# Patient Record
Sex: Female | Born: 1985 | Race: White | Hispanic: No | Marital: Single | Smoking: Current every day smoker
Health system: Southern US, Community
[De-identification: ages and names within clinical notes are randomized; demographics above are authoritative.]

## PROBLEM LIST (undated history)

## (undated) DIAGNOSIS — A64 Unspecified sexually transmitted disease: Secondary | ICD-10-CM

## (undated) DIAGNOSIS — Z8619 Personal history of other infectious and parasitic diseases: Secondary | ICD-10-CM

## (undated) DIAGNOSIS — IMO0002 Reserved for concepts with insufficient information to code with codable children: Secondary | ICD-10-CM

## (undated) HISTORY — DX: Personal history of other infectious and parasitic diseases: Z86.19

## (undated) HISTORY — DX: Unspecified sexually transmitted disease: A64

## (undated) HISTORY — PX: TYMPANOSTOMY TUBE PLACEMENT: SHX32

## (undated) HISTORY — DX: Reserved for concepts with insufficient information to code with codable children: IMO0002

---

## 2013-05-03 ENCOUNTER — Encounter: Payer: Self-pay | Admitting: Gynecology

## 2013-05-03 ENCOUNTER — Ambulatory Visit (INDEPENDENT_AMBULATORY_CARE_PROVIDER_SITE_OTHER): Payer: BC Managed Care – PPO | Admitting: Gynecology

## 2013-05-03 VITALS — BP 120/86 | HR 80 | Resp 14 | Ht 66.0 in | Wt 200.0 lb

## 2013-05-03 DIAGNOSIS — G8929 Other chronic pain: Secondary | ICD-10-CM

## 2013-05-03 DIAGNOSIS — R102 Pelvic and perineal pain: Secondary | ICD-10-CM

## 2013-05-03 DIAGNOSIS — Z0000 Encounter for general adult medical examination without abnormal findings: Secondary | ICD-10-CM

## 2013-05-03 DIAGNOSIS — N949 Unspecified condition associated with female genital organs and menstrual cycle: Secondary | ICD-10-CM

## 2013-05-03 LAB — POCT URINALYSIS DIPSTICK
PH UA: 5
UROBILINOGEN UA: NEGATIVE

## 2013-05-03 NOTE — Progress Notes (Signed)
28 y.o. Single Caucasian female   G0P0000 here for problem visit. Pt reports bilateral and low back pain for several months.  She had u/s done at [facility] in [facility], [place] 59m ago and was diagnosed with ovarian cysts. Pt was referred to gyn and was given diagnosis of questionable endometriosis and was offered Lupron, she had not had surgical diagnosis.  Pt does not recall being told she had endometriomas  Pt had tried OCP in past but had break through bleeding, same issue with DepoProvera. Last used 5y ago, but pain just began in last 52m.   Cycles regular but flow is variable, severe cramping.  Pt will use heating pads- had used pamprin, midol, vicoden and most recently percocet which she got from her family.   Pt reports that her thyroid had been checked recently after 55# weight gain and was normal. LMP 12/25, day 14 of cycle Pt is currently not sexually active.  First sexual activity at 28 years old, does not want to disclose number of lifetime partners.     Patient's last menstrual period was 04/21/2013.          Sexually active: not since 2012 The current method of family planning is none.    Exercising: yes  constantly running around at work Last pap: 6 months ago Normal Alcohol: depends glass of wine/beer couple nights a week  Tobacco: under a pack a day Drugs: no Gardisil: yes, completed: 2006-2009  Urine: Leuks 2, Trace Protein, Small Ketones, Tiny Trace Protein   No health maintenance topics applied.  Family History  Problem Relation Age of Onset  . Thyroid disease Mother   . Diabetes Mother   . Diabetes Maternal Grandmother   . Diabetes Paternal Grandfather     There are no active problems to display for this patient.   Past Medical History  Diagnosis Date  . Dyspareunia   . Endometriosis   . STD (sexually transmitted disease)   . History of chlamydia 2010/2011    Past Surgical History  Procedure Laterality Date  . Tympanostomy tube placement  When she was  a baby    Allergies: Review of patient's allergies indicates no known allergies.  Current Outpatient Prescriptions  Medication Sig Dispense Refill  . Ascorbic Acid (VITAMIN C PO) Take by mouth.      . Psyllium (METAMUCIL PO) Take by mouth.       No current facility-administered medications for this visit.    ROS: Pertinent items are noted in HPI.  Exam:    BP 120/86  Pulse 80  Resp 14  Ht 5\' 6"  (1.676 m)  Wt 200 lb (90.719 kg)  BMI 32.30 kg/m2  LMP 04/21/2013 Weight change: @WEIGHTCHANGE @ Last 3 height recordings:  Ht Readings from Last 3 Encounters:  05/03/13 5\' 6"  (1.676 m)   General appearance: alert, cooperative and appears stated age, uncomfortable Head: Normocephalic, without obvious abnormality, atraumatic Neck: no adenopathy, no carotid bruit, no JVD, supple, symmetrical, trachea midline and thyroid not enlarged, symmetric, no tenderness/mass/nodules Lungs: clear to auscultation bilaterally Breasts: normal appearance, no masses or tenderness Heart: regular rate and rhythm, S1, S2 normal, no murmur, click, rub or gallop Abdomen: soft, non-tender; bowel sounds normal; no masses,  no organomegaly Extremities: extremities normal, atraumatic, no cyanosis or edema Skin: Skin color, texture, turgor normal. No rashes or lesions Lymph nodes: Cervical, supraclavicular, and axillary nodes normal. no inguinal nodes palpated Neurologic: Grossly normal   Pelvic: External genitalia:  no lesions  Urethra: normal appearing urethra with no masses, tenderness or lesions              Bartholins and Skenes: normal                 Vagina: normal appearing vagina with normal color and discharge, no lesions              Cervix: normal appearance              Pap taken: no        Bimanual Exam:  Uterus:  tenderness anteriorly, anteverted, mobile                                      Adnexa:    difficult to assess due to habitus                                       Rectovaginal: attenuation, nodularity and tenderness bilateral, right greater than left. Levator tenderness less so                                      Anus:  normal sphincter tone, no lesions  A: pelvic pain Questionable endometriosis Partial evaluation  P: will get records from prior visits including u/s to determine if endometriomas are present.  Exam suspicious for endometriosis due to marked attenuation and nodularity of USL's bilaterally. Discussed ocp, progestins and IUD to treat symptoms but will be based on records and u/s.  Discussed the affect of each on pain.  Would prefer not to use narcotics-risk of dependence discussed.  Pt offered tramadol but reports she used a family member's tramadol in past without relief.  It would be helpful to see her full records so we can assess her prior medication trials.  We suggest she rto in 2w to review her records and she was agreeable.  We also mentioned trial of elavil or celexa.  Pt would like to try IUD if possible Suggest acupuncture as well She will loose her insurance on her [birth year], we will try to place IUD before that time, we may need beta HCG if cycle had not started.  An After Visit Summary was printed and given to the patient.   Over 3436m spent discussing pelvic pain and coordinating treatment options, >50% face to face

## 2013-05-04 ENCOUNTER — Telehealth: Payer: Self-pay | Admitting: Gynecology

## 2013-05-04 DIAGNOSIS — R102 Pelvic and perineal pain: Secondary | ICD-10-CM

## 2013-05-04 DIAGNOSIS — G8929 Other chronic pain: Secondary | ICD-10-CM | POA: Insufficient documentation

## 2013-05-04 NOTE — Telephone Encounter (Signed)
Return call to StapletonSai.  Advised that we do not have a GYN pain clinic in [place] and that we usually refer to [facility] as well. Janina MayoSai states patient called her directly today regarding referral from summer 2014. They gave patient the phone number to the clinic to let patient try to reschedule. Per OV note from yesterday, patient will lose insurance soon so was to return here in 2 weeks to discuss options and possible IUD. Not usually able to get an appointment with [facility] that quickly. Patient  does not have a follow-up appointment scheduled with our office.

## 2013-05-04 NOTE — Telephone Encounter (Signed)
Patient reached out to her [facility] in [place] after her visit with Dr. Farrel GobbleLathrop yesterday requesting a referral from them to a local pain clinic to help with her pelvic pain. Patient was referred to [facility] by them last summer but the patient never went. The office is calling to clarify with us what the patient may need after her visit yesterday and see if there is a pain clinic in [place].

## 2013-05-08 NOTE — Telephone Encounter (Signed)
Called patient/ 0 patient responsibility for IUD insertion/ advised that per insurance, coverage will term 01.31.2015/ patient needs to come in for IUD insertion during first 5 days of cycle./ssf

## 2013-05-16 NOTE — Telephone Encounter (Signed)
Pt says she is no longer interested in having the iud because she already know that she is going to bleed alot with it and she is not going thru that again.

## 2013-05-16 NOTE — Telephone Encounter (Signed)
Routing to Dr. Lathrop.   

## 2013-05-16 NOTE — Telephone Encounter (Signed)
It is her decision.  Encounter closed.

## 2013-11-24 ENCOUNTER — Ambulatory Visit (INDEPENDENT_AMBULATORY_CARE_PROVIDER_SITE_OTHER): Payer: BC Managed Care – PPO | Admitting: Internal Medicine

## 2013-11-24 VITALS — BP 130/90 | HR 70 | Temp 98.2°F | Resp 16 | Ht 67.0 in | Wt 200.4 lb

## 2013-11-24 DIAGNOSIS — M545 Low back pain, unspecified: Secondary | ICD-10-CM

## 2013-11-24 MED ORDER — PREDNISONE 20 MG PO TABS: ORAL_TABLET | ORAL | Status: AC

## 2013-11-24 MED ORDER — CYCLOBENZAPRINE HCL 10 MG PO TABS: 10.0000 mg | ORAL_TABLET | Freq: Every day | ORAL | Status: DC

## 2013-11-24 MED ORDER — OXYCODONE-ACETAMINOPHEN 10-325 MG PO TABS: ORAL_TABLET | ORAL | Status: DC

## 2013-11-24 NOTE — Progress Notes (Signed)
Subjective:  This chart was scribed for Dr. Linton Ham. Laney Pastor, MD  by Stacy Gardner, [facility] Scribe. The patient was seen in room and the patient's care was started at 12:30 PM.  Chief Complaint  Patient presents with  . 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  pt states she has took OTC Aleve and does not provide no relief. she states she has used hot/cold compress with minor relief      Patient ID: Rhonda Perry, female    DOB: [birth year], 28 y.o.   MRN: [id number]  11/24/2013  Back Pain   Back Pain Associated symptoms include numbness and pelvic pain. Pertinent negatives include no headaches or weakness.   HPI Comments: Rhonda Perry is a 28 y.o. female who arrives to the [facility] complaining of constant, moderate, lumbar pain. 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 The pain radiates "up my spine" .  Pt reports having severe pain with twisting and movement. She was unable to sleep due to pain last night. She has intermittent bilateral numbness to her feet. Pt 16 injections to her back at [facility] by Dr. Vira Blanco they only give her temporary relief.  She was seen 1.5 month ago and he has not followed up with her since that visit--she has canceled her association with that clinic because they have not been returning her phone calls. Pt was given a percocet rx and ran out of it recently. She was taking Meloxicam but it makes her sick. She has tried one round of Prednisone with? Benefits. Pt has tried [employer] w/o any relief.  She has tried taking OTC Aleve w/ no relief. She has also tried hot/cold compresses with mild relief. Nothing seems to make her symptoms resolve .She reports being clumsy and this has been a issue for her most of her life. Denies pain with deep inspiration. Pt was told that PT was an  option and was given stretches to do while at home. 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 Denies prior injury, trauma, or MVC. Pt has to lie in bed for about an hour prior to getting up. Denies any exacerbated gait issues.  She works at [employer] and tries to abstain from lifting heavy objects. Nothing seems to help.     Past Medical History  Diagnosis Date  . Dyspareunia   . STD (sexually transmitted disease)   . History of chlamydia 2010/2011   Past Surgical History  Procedure Laterality Date  . Tympanostomy tube placement  When she was a baby   History   Social History  . Marital Status: Single    Spouse Name: N/A    Number of Children: N/A  . Years of Education: N/A   Occupational History  . Not on file.   Social History Main Topics  . Smoking status: Current Every Day Smoker -- 0.50 packs/day    Types: Cigarettes  . Smokeless tobacco: Not on file  . Alcohol Use: 0.6 oz/week    1 Glasses of wine per week  . Drug Use: No  . Sexual Activity: Not on file   Other Topics Concern  . Not on file   Social History Narrative  . No narrative on file   Family History  Problem Relation Age of Onset  . Thyroid disease Mother   . Diabetes Mother   . Cancer Mother   . Diabetes Maternal Grandmother   . Diabetes Paternal Grandfather    Allergies  Allergen Reactions  . Tramadol Nausea And Vomiting  . Vicodin [Hydrocodone-Acetaminophen] Nausea And Vomiting   Current Outpatient Prescriptions  Medication Sig Dispense Refill  . Ascorbic Acid (VITAMIN C PO) Take by mouth.       No current facility-administered medications for this visit.       Review of Systems  Genitourinary: Positive for pelvic pain. Negative for frequency, hematuria and difficulty urinating.       History of chronic pelvic pain which is not currently active  Musculoskeletal: Positive for back pain and gait problem (baseline).  Neurological: Positive for  numbness. Negative for tremors, weakness and headaches.  Psychiatric/Behavioral: Positive for sleep disturbance.     Objective:     Physical Exam  Nursing note and vitals reviewed. Constitutional: She is oriented to person, place, and time. She appears well-developed and well-nourished.  overweight  HENT:  Head: Normocephalic.  Eyes: Pupils are equal, round, and reactive to light.  Neck: Normal range of motion.  Cardiovascular: Normal rate.   Pulmonary/Chest: Effort normal. No respiratory distress.  Musculoskeletal: She exhibits tenderness.  tender to palpation throughout L1 to L5 including all of paraspinal muscles.  Pain with twisting and flexion. Straight leg raise is negative on the R and mildly positive on the L at 90 degrees DT normal No sensory loss in lower extremity No motor loss in lower extremity   Neurological: She is alert and oriented to person, place, and time.  Skin: Skin is warm and dry. She is not diaphoretic.  Psychiatric: She has a normal mood and affect. Her behavior is normal.    Filed Vitals:   11/24/13 1156  BP: 130/90  Pulse: 70  Temp: 98.2 F (36.8 C)  TempSrc: Oral  Resp: 16  Height: $Remove'5\' 7"'erfxKfv$  (1.702 m)  Weight: 200 lb 6.4 oz (90.901 kg)  SpO2: 99%        Assessment & Plan:  Bilateral low back pain without sciatica - Plan: Ambulatory referral to Physical Therapy  Complex clinical situation but the one thing she has not done is have adequate physical therapy She also will get the office notes from [facility] and [facility] so we can make a better plan for the future  Meds ordered this encounter  Medications  . oxyCODONE-acetaminophen (PERCOCET) 10-325 MG per tablet    Sig: 1/2 -1 HS prn back pain    Dispense:  30 tablet    Refill:  0  . cyclobenzaprine (FLEXERIL) 10 MG tablet    Sig: Take 1 tablet (10 mg total) by mouth at bedtime.    Dispense:  30 tablet    Refill:  0  . predniSONE (DELTASONE) 20 MG  tablet    Sig: 3/3/2/2/1/1 single daily dose for 6 days    Dispense:  12 tablet    Refill:  0   F/u 2wk  I personally performed the services described in this documentation, which was scribed in my presence. The recorded information has been reviewed and is accurate.

## 2013-12-12 ENCOUNTER — Other Ambulatory Visit (HOSPITAL_COMMUNITY)
Admission: RE | Admit: 2013-12-12 | Discharge: 2013-12-12 | Disposition: A | Discharge status: Home / Self Care | Payer: BC Managed Care – PPO | Source: Ambulatory Visit | Attending: Family Medicine | Admitting: Family Medicine

## 2013-12-12 ENCOUNTER — Other Ambulatory Visit: Payer: Self-pay | Admitting: Family Medicine

## 2013-12-12 DIAGNOSIS — Z1151 Encounter for screening for human papillomavirus (HPV): Secondary | ICD-10-CM | POA: Insufficient documentation

## 2013-12-12 DIAGNOSIS — Z113 Encounter for screening for infections with a predominantly sexual mode of transmission: Secondary | ICD-10-CM | POA: Insufficient documentation

## 2013-12-12 DIAGNOSIS — Z124 Encounter for screening for malignant neoplasm of cervix: Secondary | ICD-10-CM | POA: Diagnosis not present

## 2013-12-14 LAB — CYTOLOGY - PAP

## 2013-12-19 ENCOUNTER — Ambulatory Visit (INDEPENDENT_AMBULATORY_CARE_PROVIDER_SITE_OTHER): Payer: BC Managed Care – PPO | Admitting: Internal Medicine

## 2013-12-19 ENCOUNTER — Telehealth: Payer: Self-pay | Admitting: Internal Medicine

## 2013-12-19 ENCOUNTER — Encounter: Payer: Self-pay | Admitting: Internal Medicine

## 2013-12-19 VITALS — BP 122/78 | HR 89 | Temp 98.0°F | Resp 17 | Ht 67.0 in | Wt 207.0 lb

## 2013-12-19 DIAGNOSIS — M545 Low back pain, unspecified: Secondary | ICD-10-CM

## 2013-12-19 DIAGNOSIS — G894 Chronic pain syndrome: Secondary | ICD-10-CM

## 2013-12-19 MED ORDER — CYCLOBENZAPRINE HCL 10 MG PO TABS: 10.0000 mg | ORAL_TABLET | Freq: Every day | ORAL | Status: DC

## 2013-12-19 MED ORDER — OXYCODONE-ACETAMINOPHEN 10-325 MG PO TABS: ORAL_TABLET | ORAL | Status: AC

## 2013-12-19 NOTE — Telephone Encounter (Signed)
Refill on Hydrocodone and Percocet   [phone]

## 2013-12-19 NOTE — Progress Notes (Signed)
Subjective:    Patient ID: Rhonda Perry, female    DOB: 09/29/1985, 28 y.o.   MRN: [id number]  This chart was scribed for Rhonda Sia, MD by Jarvis Morgan, Medical Scribe. This patient was seen in Room 9 and the patient's care was started at 8:21 PM.  Chief Complaint  Patient presents with  . Back Pain  . Medication Refill    HPI HPI Comments: [name] is a 28 y.o. female who presents to the [facility] for a medication refill. Patient is having constant, moderate, lumbar pain that has been occurring for the past month. She states that the pain radiates up her spine. 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 Pt was last seen in the office on 11/24/13 and was prescribed Prednisone. She states that the Prednisone has been helping somewhat. She was also prescribed Flexeril and Percocet. She admits she has been trying not to take the Percocet very often and only uses it as a last resort. She reports good relief with the Flexeril and helps her to get to sleep at night. She says that she sometimes has trouble falling asleep due to the pain. She also uses Tiger Balm for the pain and says it provides moderate relief as well. Tylenol does not seem to help with her pain at all. Has worn a back brace during the day and at work. She is waiting for insurance to approve PT. She also reports she has been trying to get Dr. Jordan Likes to fax her medical records to our office so she can be referred to [facility]. She denies any prior injury, trauma, or exacerbated gait issues. She also denies any urinary or bowel incontinence. She works at [employer] and tries to abstain from lifting heavy objects. She had a physical last week and all her lab work came back normal. She sees Dr. Beverley Fiedler at [facility].  Patient Active Problem List   Diagnosis Date Noted  . Chronic pelvic pain in female 05/04/2013    Past Medical History  Diagnosis Date  . Dyspareunia   . STD (sexually transmitted disease)   . History of chlamydia 2010/2011   Past Surgical History  Procedure Laterality Date  . Tympanostomy tube placement  When she was a baby   Allergies  Allergen Reactions  . Tramadol Nausea And Vomiting  . Vicodin [Hydrocodone-Acetaminophen] Nausea And Vomiting   Prior to Admission medications   Medication Sig Start Date End Date Taking? Authorizing Provider  Ascorbic Acid (VITAMIN C PO) Take by mouth.   Yes Historical Provider, MD  cyclobenzaprine (FLEXERIL) 10 MG tablet Take 1 tablet (10 mg total) by mouth at bedtime. 11/24/13   Tonye Pearson, MD  oxyCODONE-acetaminophen [facility]) 10-325 MG per tablet 1/2 -1 HS prn back pain 11/24/13   Tonye Pearson, MD  predniSONE (DELTASONE) 20 MG tablet 3/3/2/2/1/1 single daily dose for 6 days 11/24/13   Tonye Pearson, MD   History   Social History  . Marital Status: Single    Spouse Name: N/A    Number of Children: N/A  . Years of Education: N/A   Occupational History  . Not on file.   Social History Main Topics  . Smoking status: Current Every Day Smoker -- 0.50 packs/day for 5 years    Types: Cigarettes  . Smokeless tobacco: Not on file  . Alcohol Use: 0.6 oz/week  1 Glasses of wine per week  . Drug Use: No  . Sexual Activity: Not on file   Other Topics Concern  . Not on file   Social History Narrative  . No narrative on file     Review of Systems  Musculoskeletal: Positive for back pain. Negative for gait problem.  Psychiatric/Behavioral: Positive for sleep disturbance (trouble sleeping due to pain).  All other systems reviewed and are negative.      Objective:   Physical Exam Physical Exam  Nursing note and vitals reviewed. Constitutional: She is oriented to person, place, and time. She appears well-developed and well-nourished. No distress.  HENT:  Head: Normocephalic and atraumatic.  Eyes:  Conjunctivae and EOM are normal.  Neck: Neck supple.  Cardiovascular: Normal rate.   Pulmonary/Chest: Effort normal. No respiratory distress.  Musculoskeletal: Normal range of motion. Tender to palpation over lumbar area. Pain with SLR to 90 degrees bilaterally. Neurological: She is alert and oriented to person, place, and time.  Skin: Skin is warm and dry.  Psychiatric: She has a normal mood and affect. Her behavior is normal.    Filed Vitals:   12/19/13 2019  BP: 122/78  Pulse: 89  Temp: 98 F (36.7 C)  TempSrc: Oral  Resp: 17  Height:  (1.702 m)  Weight: 207 lb (93.895 kg)  SpO2: 100%         Assessment & Plan:  I have completed the patient encounter in its entirety as documented by the scribe, with editing by me where necessary. Liylah Najarro P. Merla Riches, M.D.   Bilateral low back pain without sciatica  Chronic pain syndrome  Meds ordered this encounter  Medications  . oxyCODONE-acetaminophen (PERCOCET) 10-325 MG per tablet    Sig: 1/2 -1 HS prn back pain    Dispense:  30 tablet    Refill:  0  . cyclobenzaprine (FLEXERIL) 10 MG tablet    Sig: Take 1 tablet (10 mg total) by mouth at bedtime.    Dispense:  30 tablet    Refill:  1  . oxyCODONE-acetaminophen (PERCOCET) 10-325 MG per tablet    Sig: Half to one at bedtime as needed/dispense 30 days after written    Dispense:  30 tablet    Refill:  0   We will continue to treat until she can find help or be referred to [facility] by Rennie Plowman try again to get records

## 2013-12-20 DIAGNOSIS — M545 Low back pain, unspecified: Secondary | ICD-10-CM | POA: Insufficient documentation

## 2013-12-20 DIAGNOSIS — G894 Chronic pain syndrome: Secondary | ICD-10-CM | POA: Insufficient documentation

## 2014-01-06 ENCOUNTER — Telehealth: Payer: Self-pay

## 2014-01-06 NOTE — Telephone Encounter (Signed)
Pt was given two rx for percocet by dr. Merla Riches at last visit. The instructions were to take one every night at bedtime. However, she said she was taking them during the day too for back pain so she ran out faster than expected. The second rx says that it has to be 30 days after date written to be refilled. Therefore she is requesting a change in the rx so she can fill it sooner.

## 2014-01-09 NOTE — Telephone Encounter (Signed)
Pt called back. She said she sent a release form to Dr. Cyndia Diver office and they are supposed to be sending [place] records She's sent her "paperwork" to [place] [facility] but hasn't heard back She's awaiting PT to see if her ins will cover it. Dr. Luciana Axe will not prescribe her narcotics  She isn't sure what to do. Says she frequently needs to take a Percocet during the day and so it leaves her short. Told her that you said you would not allow an early refill. Wanted me to let you know of the following above info anyways.  Thanks

## 2014-01-09 NOTE — Telephone Encounter (Signed)
There are multiple issues here--[place] [facility] is supposed to be involved--she was to arrange PT---she was to get [place] the records from Dr Jordan Likes so we could help arrange referral to another pain clinic --she preferred [facility] She didn't follow directions on the script For those reasons, especially the last , i'll not allow early refill She should discuss chronic use of opiates with her pcp Dr Luciana Axe and perhaps dr Luciana Axe will prescribe them

## 2014-01-17 ENCOUNTER — Telehealth: Payer: Self-pay

## 2014-03-08 NOTE — Telephone Encounter (Signed)
A user error has taken place: encounter opened in error, closed for administrative reasons.

## 2014-05-03 ENCOUNTER — Telehealth: Payer: Self-pay

## 2014-05-03 NOTE — Telephone Encounter (Signed)
The patient called to ask for a prescription for Triamcinolone Acetonide cream .1%.  She said that she was diagnosed with scabies a while back at another doctor's office, and she was prescribed that cream to help with the skin problems.  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  She was hoping that she could get a prescription written without being seen by a provider; if not, she would wait to call in to her primary provider.  Please advise.  CB#: [phone]

## 2014-05-04 NOTE — Telephone Encounter (Signed)
LM for pt- she will need to be seen and evaluated.

## 2014-06-18 ENCOUNTER — Other Ambulatory Visit: Payer: Self-pay | Admitting: Physician Assistant

## 2014-06-18 DIAGNOSIS — R110 Nausea: Secondary | ICD-10-CM

## 2014-06-18 DIAGNOSIS — R1011 Right upper quadrant pain: Secondary | ICD-10-CM

## 2014-06-25 ENCOUNTER — Other Ambulatory Visit: Payer: Self-pay | Admitting: Physician Assistant

## 2014-06-25 ENCOUNTER — Ambulatory Visit
Admission: RE | Admit: 2014-06-25 | Discharge: 2014-06-25 | Disposition: A | Discharge status: Home / Self Care | Payer: BLUE CROSS/BLUE SHIELD | Source: Ambulatory Visit | Attending: Physician Assistant | Admitting: Physician Assistant

## 2014-06-25 DIAGNOSIS — R110 Nausea: Secondary | ICD-10-CM

## 2014-06-25 DIAGNOSIS — R1011 Right upper quadrant pain: Secondary | ICD-10-CM

## 2015-09-04 IMAGING — US US ABDOMEN LIMITED
1 series · 14 of 25 positions shown · non-contrast
Comparison: None.

CLINICAL DATA: Right upper quadrant pain.

EXAM:
US ABDOMEN LIMITED - RIGHT UPPER QUADRANT

[Series 1: us abdomen limited · 0.32mm/px · 14 of 42 slices shown]
[im 1/42]
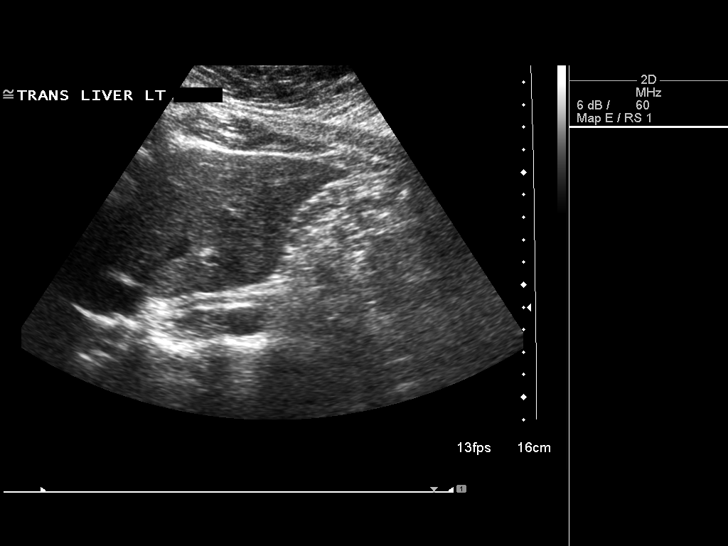
[im 4/42]
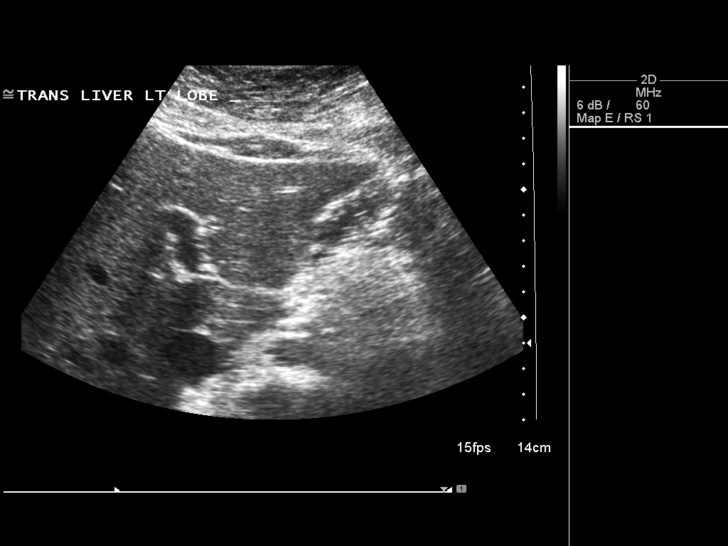
[im 7/42]
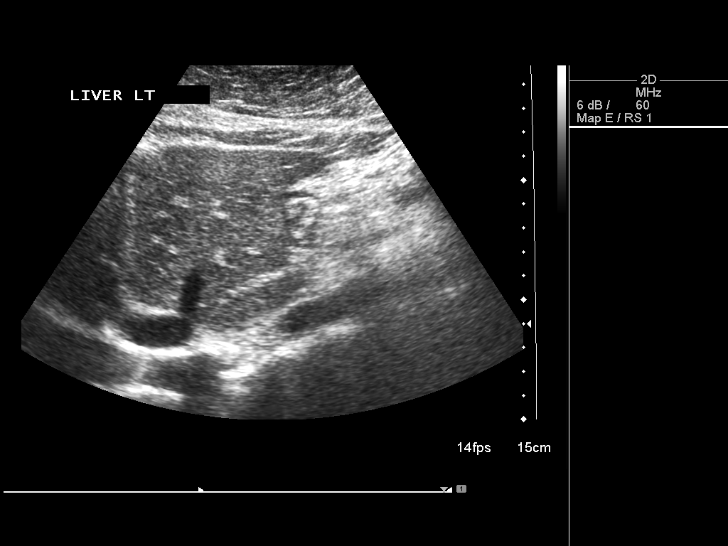
[im 11/42]
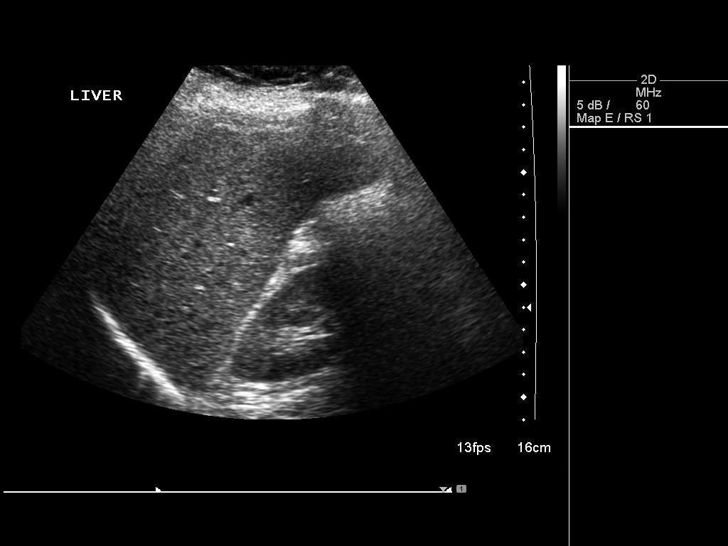
[im 14/42]
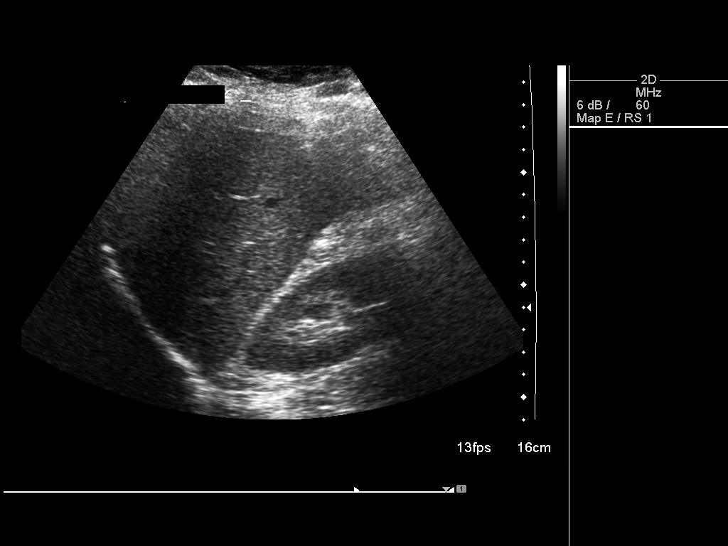
[im 16/42]
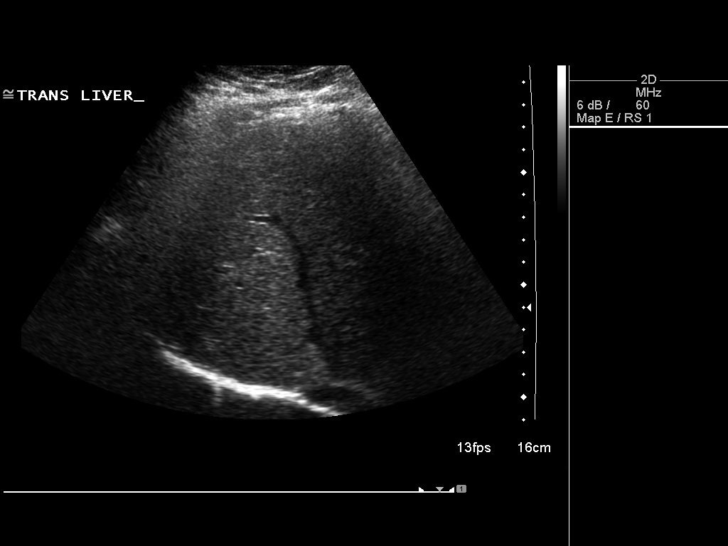
[im 19/42]
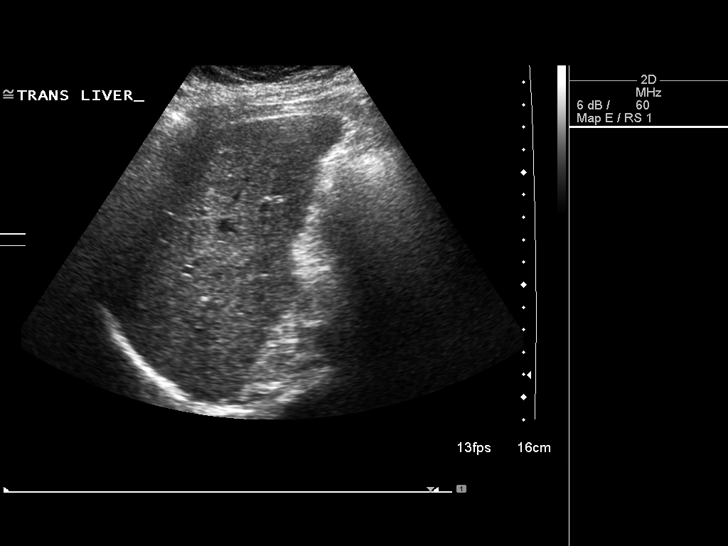
[im 23/42]
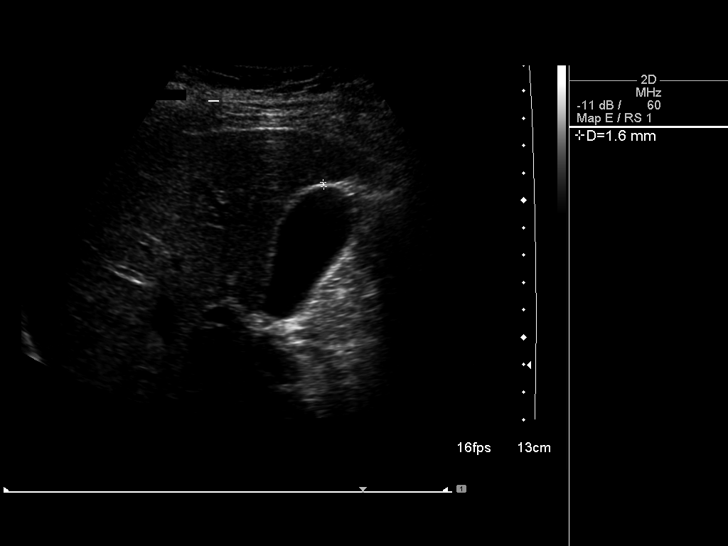
[im 26/42]
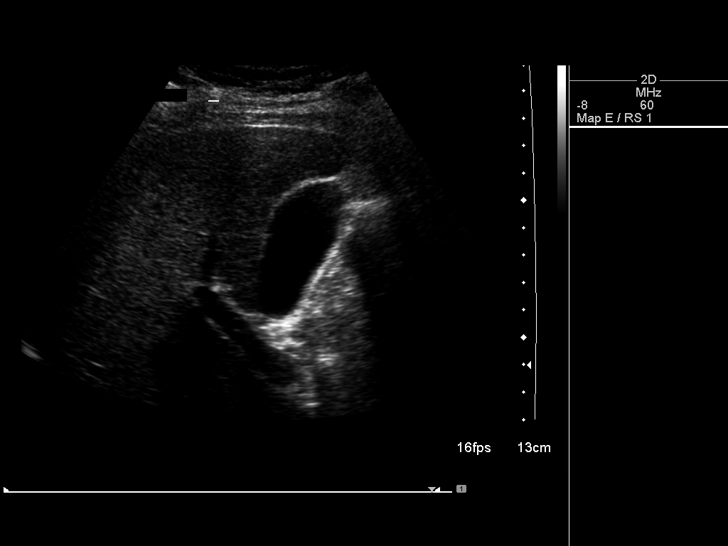
[im 28/42]
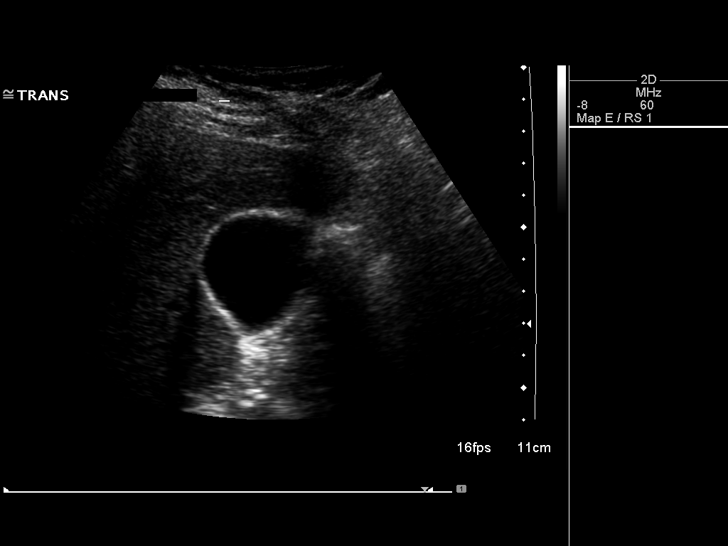
[im 31/42]
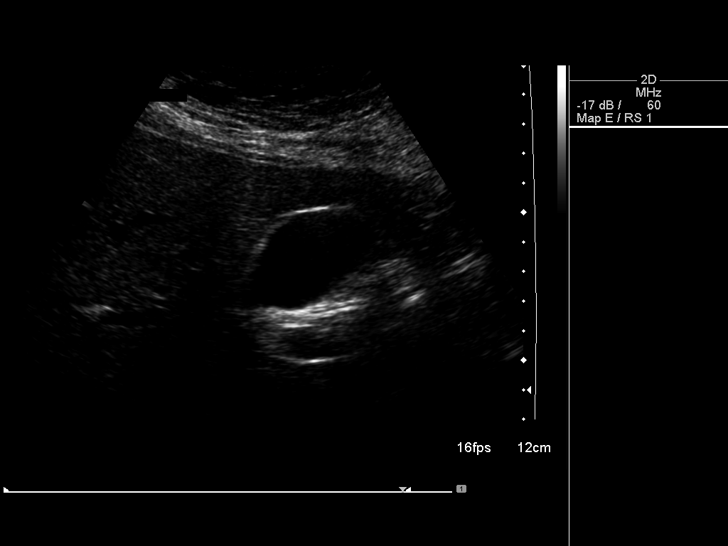
[im 35/42]
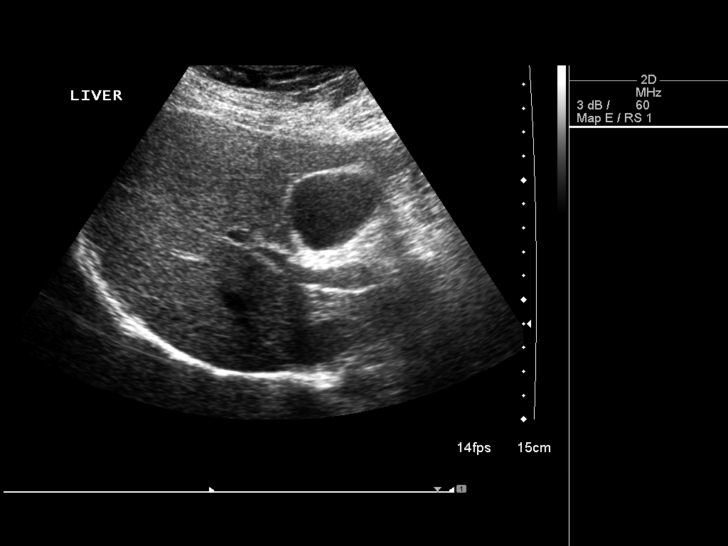
[im 38/42]
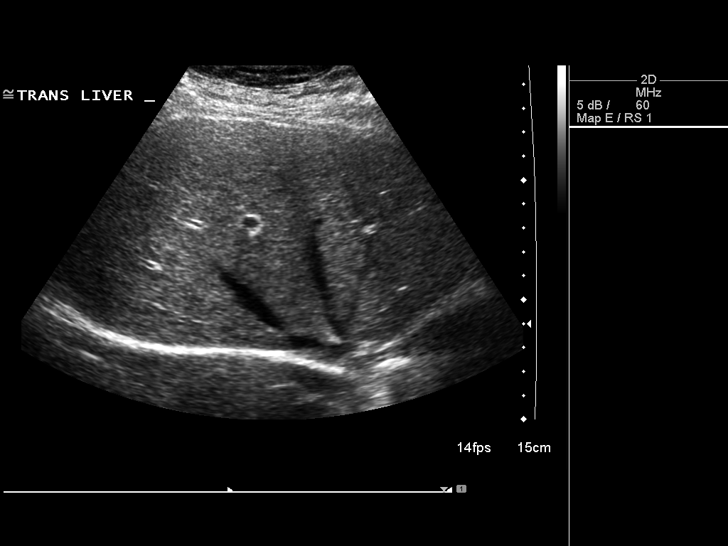
[im 42/42]
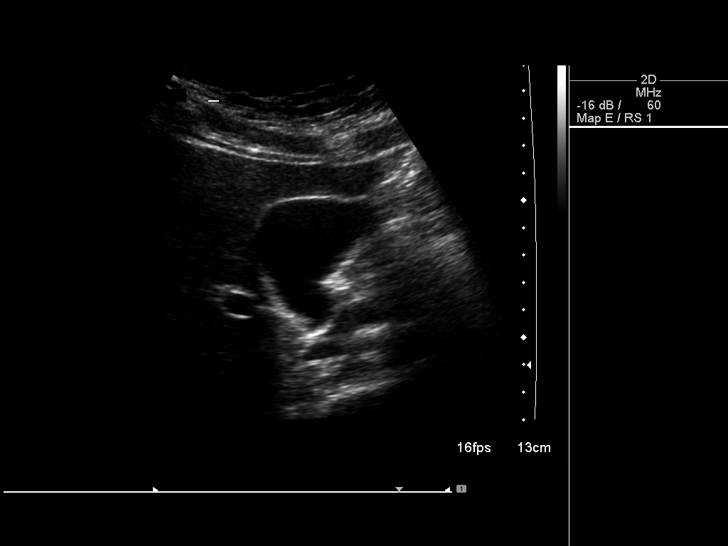

[14 of 25 positions shown; findings below may reference images not displayed]

FINDINGS: Gallbladder:

No gallstones or wall thickening visualized. No sonographic Murphy
sign noted.

Common bile duct:

Diameter: 3.0 mm

Liver:

No focal lesion identified. Within normal limits in parenchymal
echogenicity.
IMPRESSION: Normal exam.

## 2015-10-07 ENCOUNTER — Other Ambulatory Visit: Payer: Self-pay | Admitting: Nurse Practitioner

## 2015-10-07 DIAGNOSIS — N644 Mastodynia: Secondary | ICD-10-CM

## 2015-10-07 DIAGNOSIS — N6320 Unspecified lump in the left breast, unspecified quadrant: Secondary | ICD-10-CM

## 2015-10-09 ENCOUNTER — Ambulatory Visit
Admission: RE | Admit: 2015-10-09 | Discharge: 2015-10-09 | Disposition: A | Discharge status: Home / Self Care | Payer: BLUE CROSS/BLUE SHIELD | Source: Ambulatory Visit | Attending: Nurse Practitioner | Admitting: Nurse Practitioner

## 2015-10-09 DIAGNOSIS — N644 Mastodynia: Secondary | ICD-10-CM

## 2015-10-09 DIAGNOSIS — N6320 Unspecified lump in the left breast, unspecified quadrant: Secondary | ICD-10-CM

## 2016-03-02 ENCOUNTER — Ambulatory Visit (INDEPENDENT_AMBULATORY_CARE_PROVIDER_SITE_OTHER): Payer: BC Managed Care – PPO | Admitting: Family Medicine

## 2016-03-02 VITALS — BP 130/80 | HR 72 | Temp 98.1°F | Resp 17 | Ht 67.5 in | Wt 213.0 lb

## 2016-03-02 DIAGNOSIS — M545 Low back pain, unspecified: Secondary | ICD-10-CM

## 2016-03-02 MED ORDER — MELOXICAM 15 MG PO TABS: 15.0000 mg | ORAL_TABLET | Freq: Every day | ORAL | 0 refills | Status: AC

## 2016-03-02 MED ORDER — KETOROLAC TROMETHAMINE 60 MG/2ML IM SOLN: 60.0000 mg | Freq: Once | INTRAMUSCULAR | Status: DC

## 2016-03-02 MED ORDER — CYCLOBENZAPRINE HCL 10 MG PO TABS: 10.0000 mg | ORAL_TABLET | Freq: Three times a day (TID) | ORAL | 0 refills | Status: AC | PRN

## 2016-03-02 NOTE — Patient Instructions (Addendum)
   IF you received an x-ray today, you will receive an invoice from [facility]. Please contact [facility] at [phone] with questions or concerns regarding your invoice.   IF you received labwork today, you will receive an invoice from [facility]. Please contact [facility] at [phone] with questions or concerns regarding your invoice.   Our billing staff will not be able to assist you with questions regarding bills from these companies.  You will be contacted with the lab results as soon as they are available. The fastest way to get your results is to activate your My Chart account. Instructions are located on the last page of this paperwork. If you have not heard from us regarding the results in 2 weeks, please contact this office.   Back Pain, Adult Back pain is very common in adults.The cause of back pain is rarely dangerous and the pain often gets better over time.The cause of your back pain may not be known. Some common causes of back pain include:  Strain of the muscles or ligaments supporting the spine.  Wear and tear (degeneration) of the spinal disks.  Arthritis.  Direct injury to the back. For many people, back pain may return. Since back pain is rarely dangerous, most people can learn to manage this condition on their own. HOME CARE INSTRUCTIONS Watch your back pain for any changes. The following actions may help to lessen any discomfort you are feeling:  Remain active. It is stressful on your back to sit or stand in one place for long periods of time. Do not sit, drive, or stand in one place for more than 30 minutes at a time. Take short walks on even surfaces as soon as you are able.Try to increase the length of time you walk each day.  Exercise regularly as directed by your health care provider. Exercise helps your back heal faster. It also helps avoid future injury by keeping your muscles strong and flexible.  Do not  stay in bed.Resting more than 1-2 days can delay your recovery.  Pay attention to your body when you bend and lift. The most comfortable positions are those that put less stress on your recovering back. Always use proper lifting techniques, including:  Bending your knees.  Keeping the load close to your body.  Avoiding twisting.  Find a comfortable position to sleep. Use a firm mattress and lie on your side with your knees slightly bent. If you lie on your back, put a pillow under your knees.  Avoid feeling anxious or stressed.Stress increases muscle tension and can worsen back pain.It is important to recognize when you are anxious or stressed and learn ways to manage it, such as with exercise.  Take medicines only as directed by your health care provider. Over-the-counter medicines to reduce pain and inflammation are often the most helpful.Your health care provider may prescribe muscle relaxant drugs.These medicines help dull your pain so you can more quickly return to your normal activities and healthy exercise.  Apply ice to the injured area:  Put ice in a plastic bag.  Place a towel between your skin and the bag.  Leave the ice on for 20 minutes, 2-3 times a day for the first 2-3 days. After that, ice and heat may be alternated to reduce pain and spasms.  Maintain a healthy weight. Excess weight puts extra stress on your back and makes it difficult to maintain good posture. SEEK MEDICAL CARE IF:  You have pain that   is not relieved with rest or medicine.  You have increasing pain going down into the legs or buttocks.  You have pain that does not improve in one week.  You have night pain.  You lose weight.  You have a fever or chills. SEEK IMMEDIATE MEDICAL CARE IF:   You develop new bowel or bladder control problems.  You have unusual weakness or numbness in your arms or legs.  You develop nausea or vomiting.  You develop abdominal pain.  You feel faint.    This information is not intended to replace advice given to you by your health care provider. Make sure you discuss any questions you have with your health care provider.   Document Released: 04/13/2005 Document Revised: 05/04/2014 Document Reviewed: 08/15/2013 Elsevier Interactive Patient Education 2016 [employer]  

## 2016-03-02 NOTE — Progress Notes (Signed)
Chief Complaint  Patient presents with  . Back Pain    HPI   Patient reports that she fell off a step ladder with her feet in line with the stop of her stove She fell back on the ground She reports that her bottom and then fell backwards She reports that it hurt  This all happened 5 hours ago She went to rest on the couch and now her pain is 9/10 radiating up to her mid back Her pain is mid-low back She states that she hurts but it lightens up with sitting. 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  Patient's last menstrual period was 03/02/2016 (exact date).  Past Medical History:  Diagnosis Date  . Dyspareunia   . History of chlamydia 2010/2011  . STD (sexually transmitted disease)     Current Outpatient Prescriptions  Medication Sig Dispense Refill  . Ascorbic Acid (VITAMIN C PO) Take by mouth.    . cyclobenzaprine (FLEXERIL) 10 MG tablet Take 1 tablet (10 mg total) by mouth 3 (three) times daily as needed for muscle spasms. 30 tablet 0  . meloxicam (MOBIC) 15 MG tablet Take 1 tablet (15 mg total) by mouth daily. 30 tablet 0  . oxyCODONE-acetaminophen (PERCOCET) 10-325 MG per tablet 1/2 -1 HS prn back pain (Patient not taking: Reported on 03/02/2016) 30 tablet 0  . oxyCODONE-acetaminophen (PERCOCET) 10-325 MG per tablet Half to one at bedtime as needed/dispense 30 days after written (Patient not taking: Reported on 03/02/2016) 30 tablet 0  . predniSONE (DELTASONE) 20 MG tablet 3/3/2/2/1/1 single daily dose for 6 days (Patient not taking: Reported on 03/02/2016) 12 tablet 0   No current facility-administered medications for this visit.     Allergies:  Allergies  Allergen Reactions  . Tramadol Nausea And Vomiting  . Vicodin [Hydrocodone-Acetaminophen] Nausea And Vomiting    Past Surgical History:  Procedure Laterality Date  . TYMPANOSTOMY TUBE PLACEMENT  When she was a baby    Social History   Social History  . Marital status:  Single    Spouse name: N/A  . Number of children: N/A  . Years of education: N/A   Social History Main Topics  . Smoking status: Current Every Day Smoker    Packs/day: 0.50    Years: 10.00    Types: Cigarettes  . Smokeless tobacco: Never Used  . Alcohol use 0.6 oz/week    1 Glasses of wine per week  . Drug use: No  . Sexual activity: No   Other Topics Concern  . None   Social History Narrative  . None    ROS  Objective: Vitals:   03/02/16 1143  BP: 130/80  Pulse: 72  Resp: 17  Temp: 98.1 F (36.7 C)  TempSrc: Oral  SpO2: 99%  Weight: 213 lb (96.6 kg)  Height: 5' 7.5" (1.715 m)    Physical Exam  Constitutional: She appears well-developed and well-nourished.  Eyes: Conjunctivae and EOM are normal.  Neck: Normal range of motion.  Cardiovascular: Normal rate, regular rhythm and normal heart sounds.   Pulmonary/Chest: Effort normal and breath sounds normal. No respiratory distress. She has no wheezes.  Musculoskeletal:       Thoracic back: Normal. She exhibits normal range of motion and no tenderness.       Lumbar back: She exhibits tenderness, bony tenderness and spasm. She exhibits normal range of motion, no swelling, no edema, no deformity, no laceration and no pain.  Assessment and Plan Rhonda Perry was seen today for back pain.  Diagnoses and all orders for this visit:  Acute midline low back pain without sciatica -     Discontinue: ketorolac (TORADOL) injection 60 mg; Inject 2 mLs (60 mg total) into the muscle once.  Pt declined IM toradol Advised pt to try flexeril and mobic Discussed applying ice since injury just occurred today Discussed that she should reestablish with pain clinic if her pain reaggravates her chronic back pain -     cyclobenzaprine (FLEXERIL) 10 MG tablet; Take 1 tablet (10 mg total) by mouth 3 (three) times daily as needed for muscle spasms. -     meloxicam (MOBIC) 15 MG tablet; Take 1 tablet (15 mg total) by mouth daily.     Rhonda Perry A  Brandell Maready
